# Patient Record
Sex: Male | Born: 1985
Health system: Southern US, Community
[De-identification: ages and names within clinical notes are randomized; demographics above are authoritative.]

## PROBLEM LIST (undated history)

## (undated) HISTORY — PX: EYE SURGERY: SHX253

---
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
## 2007-03-23 ENCOUNTER — Telehealth: Payer: Self-pay | Admitting: Family Medicine

## 2007-03-23 ENCOUNTER — Ambulatory Visit: Payer: Self-pay | Admitting: Family Medicine

## 2007-03-23 DIAGNOSIS — R51 Headache: Secondary | ICD-10-CM

## 2007-03-23 DIAGNOSIS — S060XAA Concussion with loss of consciousness status unknown, initial encounter: Secondary | ICD-10-CM | POA: Insufficient documentation

## 2007-03-23 DIAGNOSIS — S060X9A Concussion with loss of consciousness of unspecified duration, initial encounter: Secondary | ICD-10-CM

## 2007-03-23 DIAGNOSIS — K2990 Gastroduodenitis, unspecified, without bleeding: Secondary | ICD-10-CM

## 2007-03-23 DIAGNOSIS — R519 Headache, unspecified: Secondary | ICD-10-CM | POA: Insufficient documentation

## 2007-03-23 DIAGNOSIS — K2970 Gastritis, unspecified, without bleeding: Secondary | ICD-10-CM | POA: Insufficient documentation

## 2010-08-02 NOTE — Assessment & Plan Note (Signed)
[facility]                                 ON-CALL NOTE   NAME:[name], [name]                          MRN:          [id number]  DATE:06/14/2006                            DOB:          1985/04/18    Call message for two patients:  Ryan Perkins and Ryan Perkins.   Call came in on June 14, 2006 at 11 a.m.  Caller was Jill Side, their  mother.  Regular doctor is Dr. Clent Ridges.  I am Dr. Milinda Antis on call.  Phone  number is [phone].  The patient's mother states that they are both  running a fever.  Ryan Perkins just finished Cefdinir course for strep throat.  She was some better for several days and now has a temperature of 100.8  and a very sore throat again.   Her son had strep 2 weeks ago but is better.  Now he came home with an  upset stomach and fever.  He does not have much of a sore throat,  however his fever is about the same.  Neither of them are having nausea,  vomiting, diarrhea, rash, or any other symptoms.  I told her to go ahead  and treat them with Tylenol for fever and fluids for symptomatic care if  either of them becomes worse, especially if [name]'s sore throat becomes  more severe.  She will take them to the Urgent Care today for evaluation  otherwise they will call Dr. Claris Che office in the morning to get an  appointment.     Marne A. Tower, MD  Electronically Signed    MAT/MedQ  DD: 06/14/2006  DT: 06/14/2006  Job #: [id number]   cc:   Tera Mater. Clent Ridges, MD

## 2011-07-21 ENCOUNTER — Ambulatory Visit (INDEPENDENT_AMBULATORY_CARE_PROVIDER_SITE_OTHER): Payer: BC Managed Care – PPO | Admitting: Family Medicine

## 2011-07-21 DIAGNOSIS — Z23 Encounter for immunization: Secondary | ICD-10-CM

## 2011-07-21 DIAGNOSIS — Z0000 Encounter for general adult medical examination without abnormal findings: Secondary | ICD-10-CM

## 2017-07-21 ENCOUNTER — Ambulatory Visit: Payer: Self-pay | Admitting: Emergency Medicine

## 2018-09-04 ENCOUNTER — Other Ambulatory Visit: Payer: Self-pay

## 2018-09-04 ENCOUNTER — Encounter (HOSPITAL_COMMUNITY): Payer: Self-pay | Admitting: *Deleted

## 2018-09-04 ENCOUNTER — Ambulatory Visit (INDEPENDENT_AMBULATORY_CARE_PROVIDER_SITE_OTHER): Payer: Self-pay

## 2018-09-04 ENCOUNTER — Ambulatory Visit (HOSPITAL_COMMUNITY)
Admission: EM | Admit: 2018-09-04 | Discharge: 2018-09-04 | Disposition: A | Discharge status: Home / Self Care | Payer: Self-pay | Attending: Internal Medicine | Admitting: Internal Medicine

## 2018-09-04 DIAGNOSIS — S93411A Sprain of calcaneofibular ligament of right ankle, initial encounter: Secondary | ICD-10-CM

## 2018-09-04 MED ORDER — NAPROXEN 375 MG PO TABS: 375.0000 mg | ORAL_TABLET | Freq: Two times a day (BID) | ORAL | 0 refills | Status: AC

## 2018-09-04 MED ORDER — KETOROLAC TROMETHAMINE 30 MG/ML IJ SOLN
INTRAMUSCULAR | Status: AC
  Filled 2018-09-04: qty 1

## 2018-09-04 MED ORDER — KETOROLAC TROMETHAMINE 30 MG/ML IJ SOLN
30.0000 mg | Freq: Once | INTRAMUSCULAR | Status: AC
  Administered 2018-09-04: 30 mg via INTRAMUSCULAR

## 2018-09-04 NOTE — ED Triage Notes (Signed)
Reports jumping over 6 ft fence last night, injuring right ankle.  Pt hyperventilating; instructed to take slow deep breaths.  Assisted to elevate RLE.  C/O right ankle pain, denies foot pain.  C/O numbness in RLE toes, but strong DP pulse with all toes warm, pink, with prompt cap refill.

## 2018-09-04 NOTE — ED Provider Notes (Addendum)
[facility]    CSN: [id number] Arrival date & time: 09/04/18  1043     History   Chief Complaint Chief Complaint  Patient presents with  . Ankle Injury    HPI Ryan Perkins is a 33 y.o. male comes to urgent care with with right ankle pain of 1 day duration.  Patient jumped over a fence yesterday and soon after that he started experiencing severe pain in the right ankle with swelling.  Patient was not able to bear weight on it.  Pain is constant, severe and aggravated by movement or bearing weight on it.  Patient denies any relieving factors.  He is not tried any over-the-counter medications.  No bruising.  No deformity of the foot.  No nausea vomiting.  History reviewed. No pertinent past medical history.  Patient Active Problem List   Diagnosis Date Noted  . GASTRITIS 03/23/2007  . HEADACHE 03/23/2007  . CONCUSSION 03/23/2007    Past Surgical History:  Procedure Laterality Date  . EYE SURGERY         Home Medications    Prior to Admission medications   Not on File    Family History Family History  Problem Relation Age of Onset  . Cancer Father     Social History Social History   Tobacco Use  . Smoking status: Never Smoker  . Smokeless tobacco: Never Used  Substance Use Topics  . Alcohol use: Yes    Comment: occasional  . Drug use: Not Currently    Types: Marijuana     Allergies   Cefdinir   Review of Systems Review of Systems  Constitutional: Positive for activity change. Negative for appetite change, chills, fatigue and fever.  HENT: Negative.   Eyes: Negative.   Respiratory: Negative.   Cardiovascular: Negative.   Gastrointestinal: Negative.   Musculoskeletal: Positive for arthralgias, gait problem and joint swelling. Negative for back pain, myalgias, neck pain and neck stiffness.  Skin: Negative.   Neurological: Negative for dizziness, tremors, syncope, weakness, numbness and headaches.  All other systems reviewed and are  negative.    Physical Exam Triage Vital Signs ED Triage Vitals  Enc Vitals Group     BP 09/04/18 1111 (!) 149/97     Pulse Rate 09/04/18 1111 85     Resp 09/04/18 1111 16     Temp --      Temp src --      SpO2 09/04/18 1111 100 %     Weight --      Height --      Head Circumference --      Peak Flow --      Pain Score 09/04/18 1112 10     Pain Loc --      Pain Edu? --      Excl. in [place]? --    No data found.  Updated Vital Signs BP (!) 149/97   Pulse 85   Resp 16   SpO2 100%   Visual Acuity Right Eye Distance:   Left Eye Distance:   Bilateral Distance:    Right Eye Near:   Left Eye Near:    Bilateral Near:     Physical Exam Constitutional:      General: He is in acute distress.     Appearance: Normal appearance. He is not ill-appearing.  Cardiovascular:     Rate and Rhythm: Normal rate and regular rhythm.     Pulses: Normal pulses.     Heart sounds: Normal heart  sounds.  Abdominal:     General: Bowel sounds are normal.     Palpations: Abdomen is soft.  Musculoskeletal:        General: Tenderness and signs of injury present. No deformity.     Comments: Limited range of motion around the right ankle.  No bruising.  Tenderness over the lateral malleolus  Skin:    General: Skin is warm.     Capillary Refill: Capillary refill takes less than 2 seconds.     Coloration: Skin is not jaundiced or pale.     Findings: No bruising, erythema or lesion.  Neurological:     General: No focal deficit present.     Mental Status: He is alert and oriented to person, place, and time.      UC Treatments / Results  Labs (all labs ordered are listed, but only abnormal results are displayed) Labs Reviewed - No data to display  EKG None  Radiology No results found.  Procedures Procedures (including critical care time)  Medications Ordered in UC Medications  ketorolac (TORADOL) 30 MG/ML injection 30 mg (has no administration in time range)    Initial Impression  / Assessment and Plan / UC Course  I have reviewed the triage vital signs and the nursing notes.  Pertinent labs & imaging results that were available during my care of the patient were reviewed by me and considered in my medical decision making (see chart for details).     1.  Right ankle sprain: X-ray of the right ankle was independently reviewed by me and was negative for any acute fracture Toradol 30 mg IM Naproxen 375 mg orally twice daily Patient is advised to ice his ankle, elevated and rest the ankle for the next 24-48 hours.  Subsequent to that he can start gentle stretching as well as exercises. Cam Walker Final Clinical Impressions(s) / UC Diagnoses   Final diagnoses:  Sprain of calcaneofibular ligament of right ankle, initial encounter   Discharge Instructions   None    ED Prescriptions    None     Controlled Substance Prescriptions [place] Controlled Substance Registry consulted? No   Merrilee JanskyLamptey, Philip O, MD 09/04/18 1209    Merrilee JanskyLamptey, Philip O, MD 09/06/18 [phone]

## 2020-06-24 IMAGING — DX RIGHT ANKLE - COMPLETE 3+ VIEW
3 series · 3 of 3 positions shown · non-contrast
Comparison: None.

CLINICAL DATA: Jumped over fence last evening now with diffuse
ankle pain.

EXAM:
RIGHT ANKLE - COMPLETE 3+ VIEW

[ankle ap]
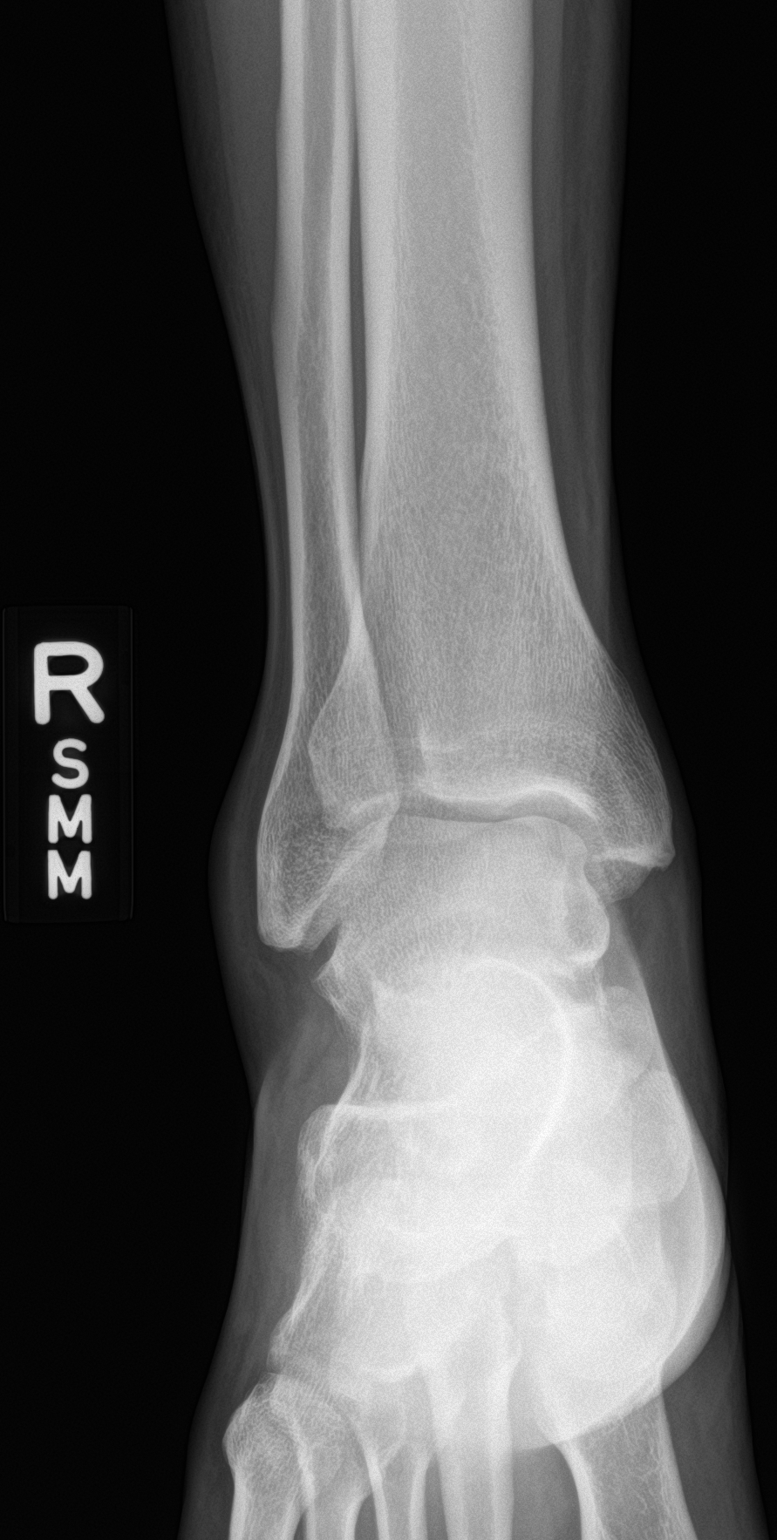

[ankle obl]
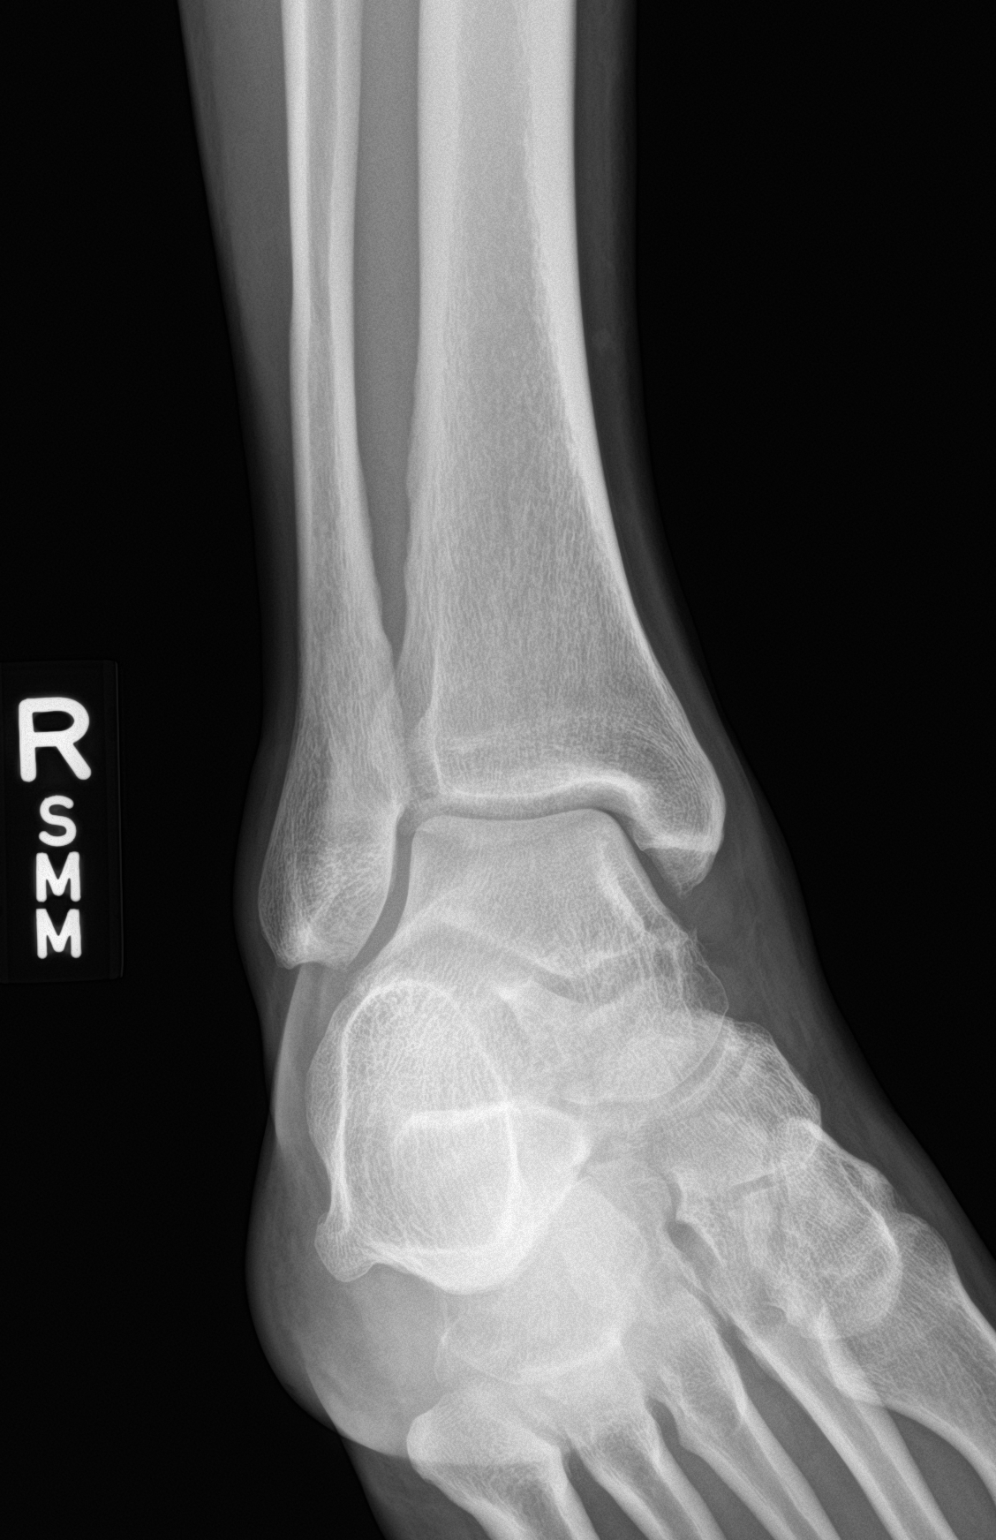

[ankle lat]
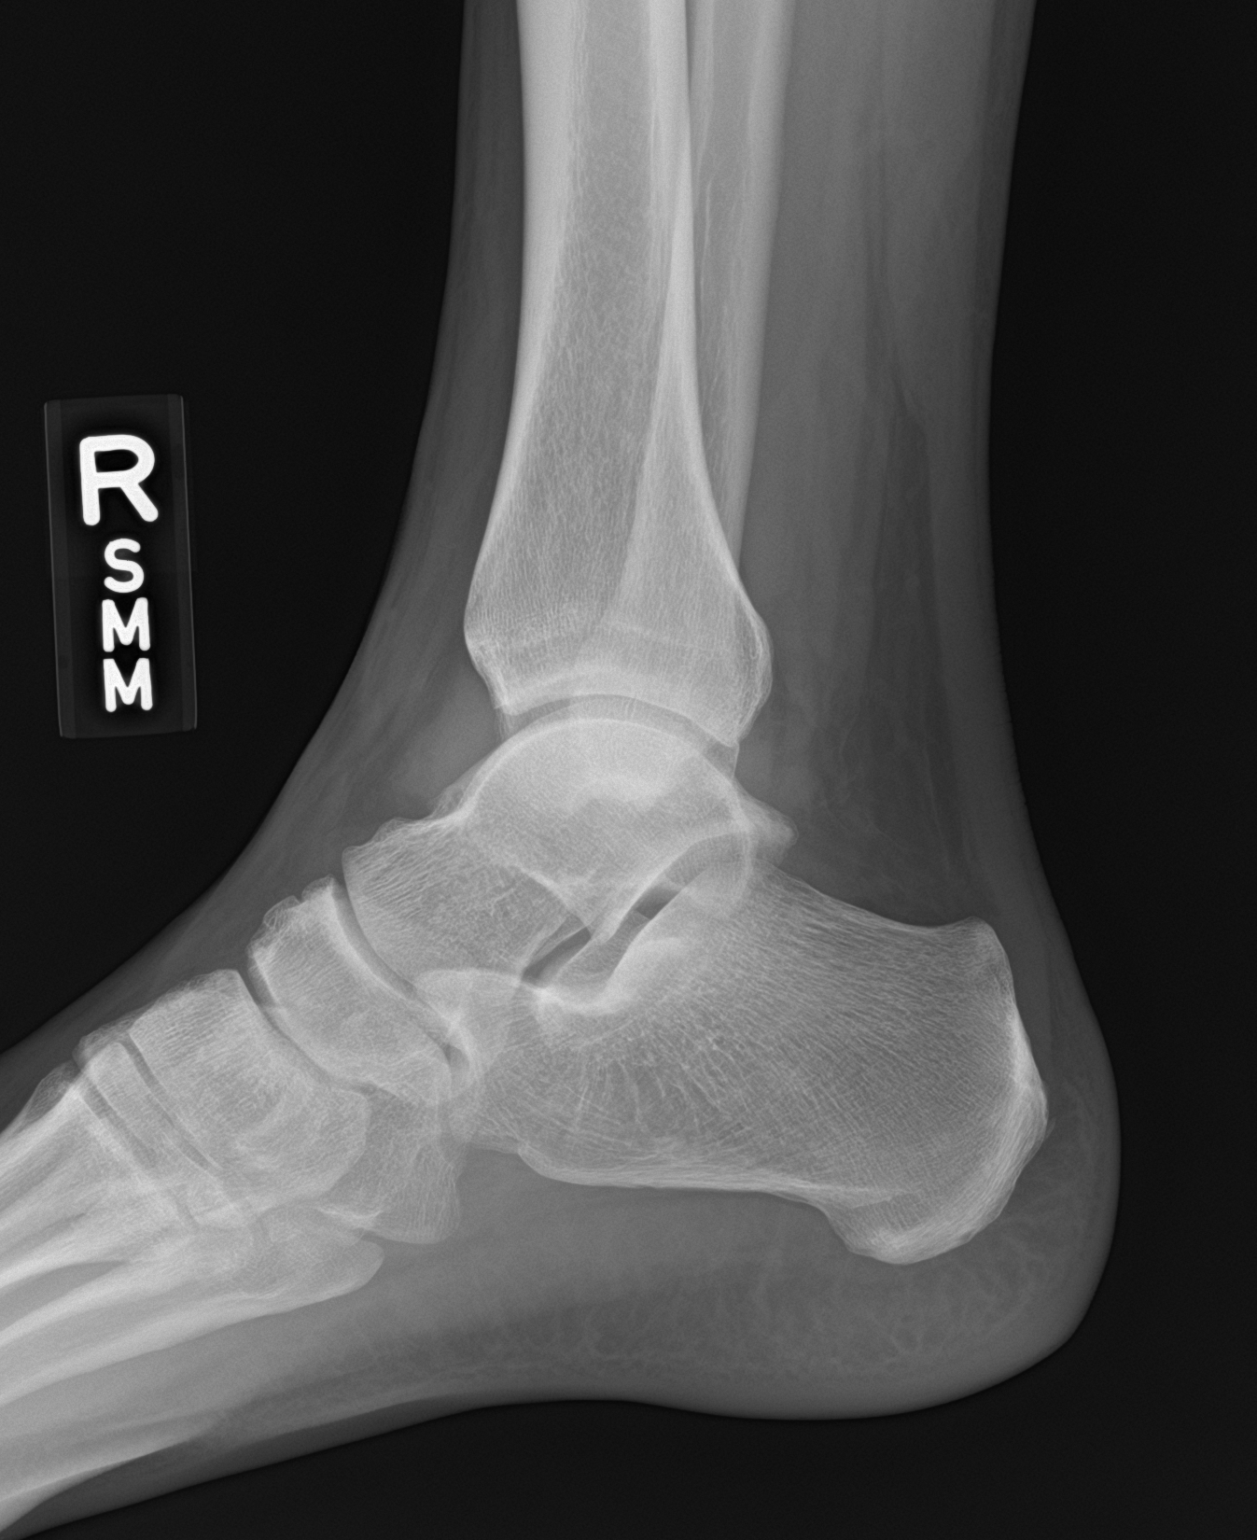

[3 of 3 positions shown; findings below may reference images not displayed]

FINDINGS: Small ankle joint effusion. Potential minimal amount of soft tissue
swelling about the lateral malleolus. No associated fracture or
dislocation. Joint spaces are preserved. Ankle mortise is preserved.
No plantar calcaneal spur. Minimal enthesopathic change involving
the Achilles tendon insertion site.
IMPRESSION: Small ankle joint effusion with minimal amount of soft tissue
swelling about the lateral malleolus without associated fracture or
radiopaque foreign body.

## 2021-07-17 ENCOUNTER — Other Ambulatory Visit: Payer: Self-pay

## 2021-07-17 ENCOUNTER — Emergency Department (HOSPITAL_BASED_OUTPATIENT_CLINIC_OR_DEPARTMENT_OTHER)
Admission: EM | Admit: 2021-07-17 | Discharge: 2021-07-17 | Disposition: A | Discharge status: Home / Self Care | Payer: Worker's Compensation | Attending: Emergency Medicine | Admitting: Emergency Medicine

## 2021-07-17 ENCOUNTER — Encounter (HOSPITAL_BASED_OUTPATIENT_CLINIC_OR_DEPARTMENT_OTHER): Payer: Self-pay

## 2021-07-17 DIAGNOSIS — Y92007 Garden or yard of unspecified non-institutional (private) residence as the place of occurrence of the external cause: Secondary | ICD-10-CM | POA: Insufficient documentation

## 2021-07-17 DIAGNOSIS — L03116 Cellulitis of left lower limb: Secondary | ICD-10-CM | POA: Diagnosis not present

## 2021-07-17 DIAGNOSIS — W57XXXA Bitten or stung by nonvenomous insect and other nonvenomous arthropods, initial encounter: Secondary | ICD-10-CM

## 2021-07-17 DIAGNOSIS — S80862A Insect bite (nonvenomous), left lower leg, initial encounter: Secondary | ICD-10-CM | POA: Insufficient documentation

## 2021-07-17 DIAGNOSIS — L0390 Cellulitis, unspecified: Secondary | ICD-10-CM

## 2021-07-17 MED ORDER — DOXYCYCLINE HYCLATE 100 MG PO TABS: 100.0000 mg | ORAL_TABLET | Freq: Once | ORAL | Status: DC

## 2021-07-17 MED ORDER — CEPHALEXIN 500 MG PO CAPS: 500.0000 mg | ORAL_CAPSULE | Freq: Four times a day (QID) | ORAL | 0 refills | Status: AC

## 2021-07-17 MED ORDER — CEPHALEXIN 250 MG PO CAPS
500.0000 mg | ORAL_CAPSULE | Freq: Once | ORAL | Status: AC
  Administered 2021-07-17: 500 mg via ORAL
  Filled 2021-07-17: qty 2

## 2021-07-17 NOTE — ED Provider Notes (Signed)
?[facility] ?Provider Note ? ? ?CSN: [id number] ?Arrival date & time: 07/17/21  2012 ? ?  ? ?History ?Chief Complaint  ?Patient presents with  ? Insect Bite  ? ?Ryan Perkins is a 36 y.o. male who presents to the emergency department with a insect bite with surrounding erythema.  This started yesterday.  Patient states he was doing some yard work for client and was going around some hay and was bit by a spider.  He denies fevers, chills, chest pain, shortness of breath, purulence. ? ?HPI ? ?  ? ?Home Medications ?Prior to Admission medications   ?Medication Sig Start Date End Date Taking? Authorizing Provider  ?cephALEXin (KEFLEX) 500 MG capsule Take 1 capsule (500 mg total) by mouth 4 (four) times daily. 07/17/21  Yes Meredeth Ide, Neftali Abair M, PA-C  ?naproxen (NAPROSYN) 375 MG tablet Take 1 tablet (375 mg total) by mouth 2 (two) times daily. 09/04/18   Lamptey, Britta Mccreedy, MD  ?   ? ?Allergies    ?Cefdinir   ? ?Review of Systems   ?Review of Systems  ?All other systems reviewed and are negative. ? ?Physical Exam ?Updated Vital Signs ?BP (!) 156/106 (BP Location: Right Arm)   Pulse 95   Temp 98.2 ?F (36.8 ?C) (Oral)   Resp 16   Ht 6\' 1"  (1.854 m)   Wt 81.6 kg   SpO2 100%   BMI 23.75 kg/m?  ?Physical Exam ?Vitals and nursing note reviewed.  ?Constitutional:   ?   Appearance: Normal appearance.  ?HENT:  ?   Head: Normocephalic and atraumatic.  ?Eyes:  ?   General:     ?   Right eye: No discharge.     ?   Left eye: No discharge.  ?   Conjunctiva/sclera: Conjunctivae normal.  ?Pulmonary:  ?   Effort: Pulmonary effort is normal.  ?Skin: ?   General: Skin is warm and dry.  ?   Comments: Insect bite with surrounding erythema to the left medial calf.  Area is slightly warm to palpation.  No obvious purulence.  No open wounds or ulcerations.  Compartments are soft.  ?Neurological:  ?   General: No focal deficit present.  ?   Mental Status: He is alert.  ?Psychiatric:     ?   Mood and Affect: Mood normal.      ?   Behavior: Behavior normal.  ? ? ? ? ?ED Results / Procedures / Treatments   ?Labs ?(all labs ordered are listed, but only abnormal results are displayed) ?Labs Reviewed - No data to display ? ?EKG ?None ? ?Radiology ?No results found. ? ?Procedures ?Procedures  ? ? ?Medications Ordered in ED ?Medications  ?cephALEXin (KEFLEX) capsule 500 mg (has no administration in time range)  ? ? ?ED Course/ Medical Decision Making/ A&P ?Clinical Course as of 07/17/21 2128  ?Wed Jul 17, 2021  ?2117 I discussed the patient's cefdinir allergy with him at the bedside.  He does not recall ever having a reaction to beta-lactam or cephalosporins in the past.  To his knowledge she has no known drug allergies.  I will trial him on Keflex.  I discussed allergic reaction symptoms with him at the bedside.  Patient expressed full understanding. [CF]  ?  ?Clinical Course User Index ?[CF] 2118 M, PA-C  ? ?                        ?Medical Decision Making ?Risk ?Prescription  drug management. ? ? ?Ryan Perkins is a 36 y.o. male who presents to the emerged from today with an insect bite.  This does appear to be cellulitic in nature with a central insect bite.  Patient does have a listed cefdinir allergy however the patient does not recall ever having a reaction to this medication.  To his knowledge he does not have any known drug allergies.  I will trial him on Keflex.  I will give him first dose here and send him a prescription home.  Patient amenable this plan.  He understands allergic symptoms and will return if he experiences them.  He is safe for discharge. ? ?Final Clinical Impression(s) / ED Diagnoses ?Final diagnoses:  ?Insect bite of left lower leg, initial encounter  ?Cellulitis, unspecified cellulitis site  ? ? ?Rx / DC Orders ?ED Discharge Orders   ? ?      Ordered  ?  cephALEXin (KEFLEX) 500 MG capsule  4 times daily       ? 07/17/21 2127  ? ?  ?  ? ?  ? ? ?  ?Teressa Lower, [place] ?07/17/21 2128 ? ?  ?Derwood Kaplan, MD ?07/21/21 [phone] ? ?

## 2021-07-17 NOTE — ED Triage Notes (Signed)
Patient here POV from Home. ? ?Endorses being at Work yesterday when he noted a Small Bite to Left Medial Distal Leg. Redness and Painfulness has increased since it began. ? ?No Fevers. No N/V/D. No other Associated Symptoms.  ? ?NAD Noted during Triage. A&Ox4. GCS 15. Ambulatory. ?

## 2021-07-17 NOTE — Discharge Instructions (Addendum)
Please take antibiotics as prescribed.  Please return to the emergency department if you experience rash, shortness of breath, worsening wound, or any other concerns you might have.  If you experience any of the symptoms stop taking the antibiotic and return to the emergency department immediately. ?
